# Patient Record
Sex: Female | Born: 1995 | Race: White | Hispanic: No | Marital: Single | Smoking: Never smoker
Health system: Southern US, Community
[De-identification: ages and names within clinical notes are randomized; demographics above are authoritative.]

---
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
## 2022-03-16 ENCOUNTER — Emergency Department (HOSPITAL_COMMUNITY)
Admission: EM | Admit: 2022-03-16 | Discharge: 2022-03-16 | Disposition: A | Discharge status: Home / Self Care | Payer: BC Managed Care – PPO | Attending: Emergency Medicine | Admitting: Emergency Medicine

## 2022-03-16 ENCOUNTER — Encounter (HOSPITAL_COMMUNITY): Payer: Self-pay

## 2022-03-16 ENCOUNTER — Other Ambulatory Visit: Payer: Self-pay

## 2022-03-16 ENCOUNTER — Emergency Department (HOSPITAL_COMMUNITY): Payer: BC Managed Care – PPO

## 2022-03-16 DIAGNOSIS — X500XXA Overexertion from strenuous movement or load, initial encounter: Secondary | ICD-10-CM | POA: Insufficient documentation

## 2022-03-16 DIAGNOSIS — Y9301 Activity, walking, marching and hiking: Secondary | ICD-10-CM | POA: Diagnosis not present

## 2022-03-16 DIAGNOSIS — S93401A Sprain of unspecified ligament of right ankle, initial encounter: Secondary | ICD-10-CM | POA: Diagnosis not present

## 2022-03-16 DIAGNOSIS — S99911A Unspecified injury of right ankle, initial encounter: Secondary | ICD-10-CM | POA: Diagnosis present

## 2022-03-16 NOTE — Discharge Instructions (Signed)
Your x-rays show no evidence for fracture and your injury appears to be a significant sprain to your ankle.  Wear Ace bandage for comfort and compression.  Ice for 20 minutes every 2 hours while awake for the next 2 days.  Elevate your leg is much as possible for the next 2 days.  Take ibuprofen 600 mg every 6 hours as needed for pain.  Follow-up with your primary doctor if symptoms are not improving in the next 1 to 2 weeks.

## 2022-03-16 NOTE — ED Provider Notes (Signed)
  [facility] EMERGENCY DEPARTMENT Provider Note   CSN: [id number] Arrival date & time: 03/16/22  0155     History  Chief Complaint  Patient presents with   Ankle Pain    Jasmine Stevenson is a 26 y.o. female.  Patient is a 26 year old female with no significant past medical history.  She presents with a right ankle injury.  Patient states she was walking across a crosswalk when she stepped awkwardly and turned her right ankle.  She describes some swelling and significant discomfort.  She describes an inability to bear weight.  She also reports feeling a pop when the injury occurred.  The history is provided by the patient.       Home Medications Prior to Admission medications   Not on File      Allergies    Penicillins    Review of Systems   Review of Systems  All other systems reviewed and are negative.   Physical Exam Updated Vital Signs BP 115/89   Pulse (!) 110   Temp 97.9 F (36.6 C) (Oral)   Resp 20   Ht 5\' 2"  (1.575 m)   Wt 56.7 kg   LMP 03/10/2022 (Approximate)   SpO2 99%   BMI 22.86 kg/m  Physical Exam Vitals and nursing note reviewed.  Constitutional:      General: She is not in acute distress.    Appearance: Normal appearance. She is not ill-appearing.  HENT:     Head: Normocephalic and atraumatic.  Pulmonary:     Effort: Pulmonary effort is normal.  Musculoskeletal:     Comments: There is significant swelling and ecchymosis noted over the lateral malleolus.  There is no medial malleoli or tenderness.  DP pulses are easily palpable and motor and sensation are intact throughout the entire foot.  Skin:    General: Skin is warm and dry.  Neurological:     Mental Status: She is alert.     ED Results / Procedures / Treatments   Labs (all labs ordered are listed, but only abnormal results are displayed) Labs Reviewed - No data to display  EKG None  Radiology DG Ankle Complete Right  Result Date: 03/16/2022 CLINICAL DATA:  Twisting  injury to the ankle with pain and swelling, initial encounter EXAM: RIGHT ANKLE - COMPLETE 3+ VIEW COMPARISON:  None Available. FINDINGS: Considerable soft tissue swelling is noted laterally. No acute fracture or dislocation is seen. IMPRESSION: Soft tissue swelling laterally without acute bony abnormality. Electronically Signed   By: 03/18/2022 M.D.   On: 03/16/2022 02:41    Procedures Procedures    Medications Ordered in ED Medications - No data to display  ED Course/ Medical Decision Making/ A&P  X-rays negative for fracture.  This to be treated as a sprain with an Ace bandage, rest, ice, elevate, and follow-up as needed if not improving in the next week.  Final Clinical Impression(s) / ED Diagnoses Final diagnoses:  None    Rx / DC Orders ED Discharge Orders     None         03/18/2022, MD 03/16/22 [phone]

## 2022-03-16 NOTE — ED Triage Notes (Signed)
Pt states she was walking when she stepped onto uneven pavement rolling her right ankle, states she heard a loud pop. Ankle is swollen, pt is able to bare minimal weight on that foot.

## 2022-07-12 ENCOUNTER — Encounter (HOSPITAL_COMMUNITY): Payer: Self-pay

## 2022-07-12 ENCOUNTER — Other Ambulatory Visit: Payer: Self-pay

## 2022-07-12 ENCOUNTER — Emergency Department (HOSPITAL_COMMUNITY)
Admission: EM | Admit: 2022-07-12 | Discharge: 2022-07-12 | Disposition: A | Discharge status: Home / Self Care | Payer: Self-pay | Attending: Emergency Medicine | Admitting: Emergency Medicine

## 2022-07-12 ENCOUNTER — Emergency Department (HOSPITAL_COMMUNITY): Payer: Medicaid Other

## 2022-07-12 DIAGNOSIS — R000 Tachycardia, unspecified: Secondary | ICD-10-CM | POA: Insufficient documentation

## 2022-07-12 DIAGNOSIS — R202 Paresthesia of skin: Secondary | ICD-10-CM | POA: Insufficient documentation

## 2022-07-12 LAB — BASIC METABOLIC PANEL
Anion gap: 9 (ref 5–15)
BUN: 7 mg/dL (ref 6–20)
CO2: 19 mmol/L — ABNORMAL LOW (ref 22–32)
Calcium: 8.7 mg/dL — ABNORMAL LOW (ref 8.9–10.3)
Chloride: 108 mmol/L (ref 98–111)
Creatinine, Ser: 0.67 mg/dL (ref 0.44–1.00)
GFR, Estimated: >60 mL/min (ref >60)
Glucose, Bld: 91 mg/dL (ref 70–99)
Potassium: 3.5 mmol/L (ref 3.5–5.1)
Sodium: 136 mmol/L (ref 135–145)

## 2022-07-12 LAB — CBC WITH DIFFERENTIAL/PLATELET
Abs Immature Granulocytes: 0.03 10*3/uL (ref 0.00–0.07)
Basophils Absolute: 0.1 10*3/uL (ref 0.0–0.1)
Basophils Relative: 1 %
Eosinophils Absolute: 0 10*3/uL (ref 0.0–0.5)
Eosinophils Relative: 0 %
HCT: 41.3 % (ref 36.0–46.0)
Hemoglobin: 14.3 g/dL (ref 12.0–15.0)
Immature Granulocytes: 0 %
Lymphocytes Relative: 17 %
Lymphs Abs: 1.9 10*3/uL (ref 0.7–4.0)
MCH: 30.8 pg (ref 26.0–34.0)
MCHC: 34.6 g/dL (ref 30.0–36.0)
MCV: 88.8 fL (ref 80.0–100.0)
Monocytes Absolute: 0.6 10*3/uL (ref 0.1–1.0)
Monocytes Relative: 5 %
Neutro Abs: 8.5 10*3/uL — ABNORMAL HIGH (ref 1.7–7.7)
Neutrophils Relative %: 77 %
Platelets: 273 10*3/uL (ref 150–400)
RBC: 4.65 MIL/uL (ref 3.87–5.11)
RDW: 12.5 % (ref 11.5–15.5)
WBC: 11.2 10*3/uL — ABNORMAL HIGH (ref 4.0–10.5)
nRBC: 0 % (ref 0.0–0.2)

## 2022-07-12 LAB — PREGNANCY, URINE: Preg Test, Ur: NEGATIVE

## 2022-07-12 LAB — D-DIMER, QUANTITATIVE: D-Dimer, Quant: 1.03 ug/mL-FEU — ABNORMAL HIGH (ref 0.00–0.50)

## 2022-07-12 MED ORDER — IOHEXOL 350 MG/ML SOLN
75.0000 mL | Freq: Once | INTRAVENOUS | Status: AC | PRN
  Administered 2022-07-12: 75 mL via INTRAVENOUS

## 2022-07-12 NOTE — ED Provider Notes (Signed)
[facility] EMERGENCY DEPARTMENT Provider Note   CSN: [id number] Arrival date & time: 07/12/22  2008     History  Chief Complaint  Patient presents with   Numbness    Jasmine Stevenson is a 26 y.o. female.  HPI      Jasmine Stevenson is a 26 y.o. female who presents to the Emergency Department requesting evaluation of numbness and tingling of her right forearm area that started around 3 PM this evening.  She states that she was in the seated position with her elbows on her knees this afternoon crocheting when the symptoms began.  Throughout the afternoon, she states that she has experienced the same tingling sensation radiating up to her shoulder, right lower face, right chest and right lateral upper leg.  She has tingling and numbness of the third fourth and fifth fingers as well.  She is here out of concern for possible blood clot versus stroke.  She does state that most of her symptoms seem to be localized to her elbow area.  She started oral birth control 2 weeks ago.  She denies any chest pain or shortness of breath.  She is accompanied by her mother who denies any changes in speech or gait.  No recent injuries   Home Medications Prior to Admission medications   Not on File      Allergies    Penicillins    Review of Systems   Review of Systems  Constitutional:  Negative for appetite change and chills.  Eyes:  Negative for visual disturbance.  Respiratory:  Negative for shortness of breath.   Cardiovascular:  Negative for chest pain.  Gastrointestinal:  Negative for abdominal pain, nausea and vomiting.  Genitourinary:  Negative for dysuria and flank pain.  Musculoskeletal:  Negative for arthralgias, myalgias and neck pain.  Skin:  Negative for color change and rash.  Neurological:  Positive for numbness (nummbness and tingling right forearm, elbow, upper  arm and right lower face). Negative for dizziness, syncope, facial asymmetry, speech difficulty and headaches.   Psychiatric/Behavioral:  Negative for confusion.     Physical Exam Updated Vital Signs BP (!) 142/99 (BP Location: Right Arm)   Pulse (!) 127   Temp 99.8 F (37.7 C) (Oral)   Resp 16   Ht 5\' 2"  (1.575 m)   Wt 56.7 kg   SpO2 100%   BMI 22.86 kg/m  Physical Exam Vitals and nursing note reviewed.  Constitutional:      General: She is not in acute distress.    Appearance: Normal appearance. She is not ill-appearing or toxic-appearing.  HENT:     Head: Atraumatic.     Mouth/Throat:     Pharynx: Oropharynx is clear.  Eyes:     Extraocular Movements: Extraocular movements intact.     Conjunctiva/sclera: Conjunctivae normal.     Pupils: Pupils are equal, round, and reactive to light.  Cardiovascular:     Rate and Rhythm: Regular rhythm. Tachycardia present.     Pulses: Normal pulses.  Pulmonary:     Effort: Pulmonary effort is normal. No respiratory distress.     Breath sounds: Normal breath sounds. No wheezing or rhonchi.  Chest:     Chest wall: No tenderness.  Abdominal:     Palpations: Abdomen is soft.     Tenderness: There is no abdominal tenderness.  Musculoskeletal:        General: Normal range of motion.     Cervical back: Normal range of motion. No rigidity  or tenderness.  Lymphadenopathy:     Cervical: No cervical adenopathy.  Skin:    General: Skin is warm.     Capillary Refill: Capillary refill takes less than 2 seconds.     Findings: No rash.  Neurological:     General: No focal deficit present.     Mental Status: She is alert.     GCS: GCS eye subscore is 4. GCS verbal subscore is 5. GCS motor subscore is 6.     Sensory: Sensation is intact. No sensory deficit.     Motor: Motor function is intact. No weakness.     Coordination: Coordination is intact.     Comments: CN II through XII intact.  Speech clear.  No pronator drift.  No facial droop.  Normal finger-nose testing.     ED Results / Procedures / Treatments   Labs (all labs ordered are listed,  but only abnormal results are displayed) Labs Reviewed  CBC WITH DIFFERENTIAL/PLATELET - Abnormal; Notable for the following components:      Result Value   WBC 11.2 (*)    Neutro Abs 8.5 (*)    All other components within normal limits  D-DIMER, QUANTITATIVE - Abnormal; Notable for the following components:   D-Dimer, Quant 1.03 (*)    All other components within normal limits  BASIC METABOLIC PANEL - Abnormal; Notable for the following components:   CO2 19 (*)    Calcium 8.7 (*)    All other components within normal limits  PREGNANCY, URINE  POC URINE PREG, ED    EKG None  Radiology CT Angio Chest PE W and/or Wo Contrast  Result Date: 07/12/2022 CLINICAL DATA:  Pulmonary embolism suspected, low to intermediate probability. Positive D-dimer, right arm numbness and tingling. EXAM: CT ANGIOGRAPHY CHEST WITH CONTRAST TECHNIQUE: Multidetector CT imaging of the chest was performed using the standard protocol during bolus administration of intravenous contrast. Multiplanar CT image reconstructions and MIPs were obtained to evaluate the vascular anatomy. RADIATION DOSE REDUCTION: This exam was performed according to the departmental dose-optimization program which includes automated exposure control, adjustment of the mA and/or kV according to patient size and/or use of iterative reconstruction technique. CONTRAST:  59mL OMNIPAQUE IOHEXOL 350 MG/ML SOLN COMPARISON:  None Available. FINDINGS: Cardiovascular: The heart is normal in size and there is no pericardial effusion. The aorta and pulmonary trunk are normal in caliber. No evidence of pulmonary embolism. Mediastinum/Nodes: No enlarged mediastinal, hilar, or axillary lymph nodes. Thyroid gland, trachea, and esophagus demonstrate no significant findings. Soft tissue attenuation is noted in the anterior mediastinum, possible residual thymic tissue. Lungs/Pleura: Lungs are clear. No pleural effusion or pneumothorax. Upper Abdomen: No acute  abnormality. Musculoskeletal: No chest wall abnormality. No acute or significant osseous findings. Review of the MIP images confirms the above findings. IMPRESSION: No evidence of pulmonary embolism or other acute process. Electronically Signed   By: Thornell Sartorius M.D.   On: 07/12/2022 23:15    Procedures Procedures    Medications Ordered in ED Medications - No data to display  ED Course/ Medical Decision Making/ A&P                           Medical Decision Making Patient here for evaluation of numbness and tingling of her right upper extremity, right lateral chest wall and right lower face.  Symptoms began while sitting with her elbows flexed on her knees and crocheting.  Describes stinging numb tingling sensations of  her arm that also involve the third fourth and fifth fingers of the right hand.  No history of recent injury, prior neck pain or carpal tunnel syndrome.  On exam, patient smiling and laughing with family at bedside.  No focal neurodeficits on my exam.  Vital signs, patient is tachycardic.  States she feels anxious. I suspect patient's symptoms are related to anxiety, may also involve some carpal tunnel.  Since patient is tachycardic will need further evaluation for possible PE although this is felt less likely.  If D-dimer reassuring I feel the patient is appropriate for discharge home,  Amount and/or Complexity of Data Reviewed Labs: ordered.    Details: Labs interpreted by me, no significant leukocytosis, chemistries without significant derangement.  D-dimer elevated 1.03, urine preg negative Radiology: ordered.    Details: Because D-dimer elevated patient reports recent oral birth control, CT angio chest ordered for further evaluation of possible PE.  CT angio chest w/o evidence of PE Discussion of management or test interpretation with external provider(s): Pt has reassuring work up.  Remains tachycardic, but improved.  CT angio w/o evidence of PE.  Paraesthesias of the  right arm felt to be positional.  No concerning sx's for emergent process.  Felt to be appropriate for d/c home.             Final Clinical Impression(s) / ED Diagnoses Final diagnoses:  Paresthesias  Tachycardia    Rx / DC Orders ED Discharge Orders     None         Rosey Bath 07/12/22 2337    Eber Hong, MD 07/13/22 2051

## 2022-07-12 NOTE — Discharge Instructions (Signed)
The CT scan of your chest did not show evidence of a blood clot.  Your elevated heart rate may be related to anxiety.  I recommend that you follow-up with your primary care provider for recheck.  Rest your right arm, you can alternate with ice and heat to the area as well.

## 2022-07-12 NOTE — ED Triage Notes (Signed)
POV from home. Cc of right arm numbness and tingling that started around 3pm. Was Crocheting when the numbness started so she is hoping that it might be a pinched nerve. Very anxious in triage.

## 2022-07-14 ENCOUNTER — Emergency Department (HOSPITAL_COMMUNITY): Payer: Self-pay

## 2022-07-14 ENCOUNTER — Other Ambulatory Visit: Payer: Self-pay

## 2022-07-14 ENCOUNTER — Emergency Department (HOSPITAL_COMMUNITY)
Admission: EM | Admit: 2022-07-14 | Discharge: 2022-07-14 | Disposition: A | Discharge status: Home / Self Care | Payer: Self-pay | Attending: Emergency Medicine | Admitting: Emergency Medicine

## 2022-07-14 ENCOUNTER — Encounter (HOSPITAL_COMMUNITY): Payer: Self-pay | Admitting: Emergency Medicine

## 2022-07-14 DIAGNOSIS — R29898 Other symptoms and signs involving the musculoskeletal system: Secondary | ICD-10-CM

## 2022-07-14 DIAGNOSIS — R531 Weakness: Secondary | ICD-10-CM | POA: Insufficient documentation

## 2022-07-14 DIAGNOSIS — R000 Tachycardia, unspecified: Secondary | ICD-10-CM | POA: Insufficient documentation

## 2022-07-14 DIAGNOSIS — R202 Paresthesia of skin: Secondary | ICD-10-CM | POA: Insufficient documentation

## 2022-07-14 DIAGNOSIS — R200 Anesthesia of skin: Secondary | ICD-10-CM | POA: Insufficient documentation

## 2022-07-14 LAB — CBC WITH DIFFERENTIAL/PLATELET
Abs Immature Granulocytes: 0.04 10*3/uL (ref 0.00–0.07)
Basophils Absolute: 0.1 10*3/uL (ref 0.0–0.1)
Basophils Relative: 1 %
Eosinophils Absolute: 0.1 10*3/uL (ref 0.0–0.5)
Eosinophils Relative: 1 %
HCT: 40.4 % (ref 36.0–46.0)
Hemoglobin: 14.2 g/dL (ref 12.0–15.0)
Immature Granulocytes: 0 %
Lymphocytes Relative: 21 %
Lymphs Abs: 1.9 10*3/uL (ref 0.7–4.0)
MCH: 31.1 pg (ref 26.0–34.0)
MCHC: 35.1 g/dL (ref 30.0–36.0)
MCV: 88.6 fL (ref 80.0–100.0)
Monocytes Absolute: 0.6 10*3/uL (ref 0.1–1.0)
Monocytes Relative: 7 %
Neutro Abs: 6.5 10*3/uL (ref 1.7–7.7)
Neutrophils Relative %: 70 %
Platelets: 244 10*3/uL (ref 150–400)
RBC: 4.56 MIL/uL (ref 3.87–5.11)
RDW: 12.4 % (ref 11.5–15.5)
WBC: 9.1 10*3/uL (ref 4.0–10.5)
nRBC: 0 % (ref 0.0–0.2)

## 2022-07-14 LAB — BASIC METABOLIC PANEL
Anion gap: 7 (ref 5–15)
BUN: 7 mg/dL (ref 6–20)
CO2: 20 mmol/L — ABNORMAL LOW (ref 22–32)
Calcium: 8.7 mg/dL — ABNORMAL LOW (ref 8.9–10.3)
Chloride: 110 mmol/L (ref 98–111)
Creatinine, Ser: 0.67 mg/dL (ref 0.44–1.00)
GFR, Estimated: >60 mL/min (ref >60)
Glucose, Bld: 90 mg/dL (ref 70–99)
Potassium: 3.7 mmol/L (ref 3.5–5.1)
Sodium: 137 mmol/L (ref 135–145)

## 2022-07-14 MED ORDER — GADOBUTROL 1 MMOL/ML IV SOLN
7.0000 mL | Freq: Once | INTRAVENOUS | Status: AC | PRN
  Administered 2022-07-14: 7 mL via INTRAVENOUS

## 2022-07-14 MED ORDER — IBUPROFEN 600 MG PO TABS: 600.0000 mg | ORAL_TABLET | Freq: Four times a day (QID) | ORAL | 0 refills | Status: AC | PRN

## 2022-07-14 MED ORDER — LORAZEPAM 2 MG/ML IJ SOLN
1.0000 mg | Freq: Once | INTRAMUSCULAR | Status: AC
  Administered 2022-07-14: 1 mg via INTRAVENOUS
  Filled 2022-07-14: qty 1

## 2022-07-14 NOTE — ED Notes (Signed)
Assisted pt with removal of piercings. Prvoided warm blanket. Pt states she does not have any metal on, nor is she claustrophobic.

## 2022-07-14 NOTE — ED Provider Notes (Signed)
  Provider Note MRN:  [id number]  Arrival date & time: 07/14/22    ED Course and Medical Decision Making  Assumed care from D Glick at shift change.  See note from prior team for complete details, in brief:  26 yo female Here with arm numbness she attributes to poor posture while crocheting Seen 11/25 with similar Only right arm is affected MRI c-spine w/wo pending Was noted similar tachy during last visit 11/25 Pt reports typically very anxious in healthcare setting, HR will go up to 120-140's per pt EKG sinus tachy, no cp or palpitations, had recent CTPE study at last ED visit which did not show PE  Plan per prior physician f/u MRI, likely o/p neuro f/u  MRI has resulted, it is WNL. Pt re-assessed, she is feeling somewhat better. Somewhat improved ROM/sensation to her RUE. There is reproducible paresthesias to her RUE with percussion of guyon canal, no discomfort with phalen/reverse phalen/percussion at base of wrist.   Recommend she f/u with neuro for EMG, further eval. Consider ulnar nerve related issue a/w her positioning from recent activity.  The patient improved significantly and was discharged in stable condition. Detailed discussions were had with the patient regarding current findings, and need for close f/u with PCP or on call doctor. The patient has been instructed to return immediately if the symptoms worsen in any way for re-evaluation. Patient verbalized understanding and is in agreement with current care plan. All questions answered prior to discharge.    Clinical Course as of 07/14/22 0843  Mon Jul 14, 2022  0801 MRI has resulted, WNL [SG]    Clinical Course User Index [SG] Sloan Leiter, DO     Procedures  Final Clinical Impressions(s) / ED Diagnoses     ICD-10-CM   1. Paresthesia of right arm  R20.2     2. Weakness of right arm  R29.898       ED Discharge Orders          Ordered    Ambulatory referral to Neurology       Comments: An appointment is  requested in approximately: 1 week   07/14/22 0821    ibuprofen (ADVIL) 600 MG tablet  Every 6 hours PRN        07/14/22 0839              Discharge Instructions      It was a pleasure caring for you today in the emergency department.  Please follow up with PCP in regards to your elevated heart rate  Please follow up with neurology in regards to arm paresthesia / tingling sensation  Please return to the emergency department for any worsening or worrisome symptoms.           Sloan Leiter, DO 07/14/22 [id number]

## 2022-07-14 NOTE — Discharge Instructions (Addendum)
It was a pleasure caring for you today in the emergency department.  Please follow up with PCP in regards to your elevated heart rate  Please follow up with neurology in regards to arm paresthesia / tingling sensation  Please return to the emergency department for any worsening or worrisome symptoms.

## 2022-07-14 NOTE — ED Triage Notes (Signed)
Pt c/o continued right arm numbness and tingling since yesterday. Seen yesterday for same. Pt also c/o dry mouth and not being able to sleep good tonight.

## 2022-07-14 NOTE — ED Provider Notes (Signed)
[facility] EMERGENCY DEPARTMENT Provider Note   CSN: [id number] Arrival date & time: 07/14/22  0358     History  Chief Complaint  Patient presents with   Numbness    Jasmine Stevenson is a 26 y.o. female.  The history is provided by the patient.  She has no significant past history and comes in complaining of numbness and weakness in her right arm which started yesterday afternoon.  She was seen in the emergency department yesterday evening and had a work-up which included CT angiogram of the chest to rule out pulmonary embolism (CT angiogram obtained because of tachycardia) and comes in with progression of the numbness and now inability to use the muscles in her arm.  She denies any problems with her leg or face.  She denies any neck pain.  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   Home Medications Prior to Admission medications   Not on File      Allergies    Penicillins    Review of Systems   Review of Systems  All other systems reviewed and are negative.   Physical Exam Updated Vital Signs BP (!) 146/100 (BP Location: Left Arm)   Pulse (!) 120   Resp 16   Ht 5\' 2"  (1.575 m)   Wt 56.7 kg   SpO2 99%   BMI 22.86 kg/m  Physical Exam Vitals and nursing note reviewed.   26 year old female, resting comfortably and in no acute distress. Vital signs are significant for elevated blood pressure and heart rate. Oxygen saturation is 99%, which is normal. Head is normocephalic and atraumatic. PERRLA, EOMI. Oropharynx is clear. Neck is nontender and supple without adenopathy or JVD. Back is nontender and there is no CVA tenderness. Lungs are clear without rales, wheezes, or rhonchi. Chest is nontender. Heart h is tachycardic without murmur. Abdomen is soft, flat, nontender. Extremities have no cyanosis or edema, full range of motion is  present. Skin is warm and dry without rash. Neurologic: Mental status is normal, cranial nerves are intact.  There is marked decrease sensation to the right arm with normal sensation beginning at the midline.  Sensation in the legs is normal.  Strength is normal in the left arm and both legs.  Strength is decreased in the right arm.  She has strength of 1/5 in muscles of finger extension and flexion, wrist flexion and extension.  Strength is 4/5 for elbow extension and flexion.  There is normal strength of shoulder forward flexion but I am having difficulty having her cooperate with assessment of shoulder abduction.  ED Results / Procedures / Treatments   Results for orders placed or performed during the hospital encounter of 07/14/22  Basic metabolic panel  Result Value Ref Range   Sodium 137 135 - 145 mmol/L   Potassium 3.7 3.5 - 5.1 mmol/L   Chloride 110 98 - 111 mmol/L   CO2 20 (L) 22 - 32 mmol/L   Glucose, Bld 90 70 - 99 mg/dL   BUN 7 6 - 20 mg/dL   Creatinine, Ser 07/16/22 0.44 - 1.00 mg/dL   Calcium 8.7 (L) 8.9 - 10.3 mg/dL   GFR, Estimated 9.21 >19 mL/min   Anion gap 7 5 - 15  CBC with Differential  Result Value Ref Range   WBC 9.1 4.0 - 10.5 K/uL   RBC 4.56 3.87 - 5.11 MIL/uL  Hemoglobin 14.2 12.0 - 15.0 g/dL   HCT 58.0 99.8 - 33.8 %   MCV 88.6 80.0 - 100.0 fL   MCH 31.1 26.0 - 34.0 pg   MCHC 35.1 30.0 - 36.0 g/dL   RDW 25.0 53.9 - 76.7 %   Platelets 244 150 - 400 K/uL   nRBC 0.0 0.0 - 0.2 %   Neutrophils Relative % 70 %   Neutro Abs 6.5 1.7 - 7.7 K/uL   Lymphocytes Relative 21 %   Lymphs Abs 1.9 0.7 - 4.0 K/uL   Monocytes Relative 7 %   Monocytes Absolute 0.6 0.1 - 1.0 K/uL   Eosinophils Relative 1 %   Eosinophils Absolute 0.1 0.0 - 0.5 K/uL   Basophils Relative 1 %   Basophils Absolute 0.1 0.0 - 0.1 K/uL   Immature Granulocytes 0 %   Abs Immature Granulocytes 0.04 0.00 - 0.07 K/uL    Procedures Procedures    Medications Ordered in ED Medications - No data to  display  ED Course/ Medical Decision Making/ A&P                           Medical Decision Making Amount and/or Complexity of Data Reviewed Labs: ordered. Radiology: ordered.  Risk Prescription drug management.   Weakness and numbness of the right arm.  Pattern does not follow a strict neurologic pathway of either cervical radiculopathy or peripheral neuropathy.  Consider factitious disorder.  Consider multiple sclerosis.  Doubt Guillain-Barr syndrome.  No tick identified for tick paralysis.  Symptoms are not consistent with stroke.  I have reviewed her prior records, and at her ED visit on 07/12/2022 she had mild leukocytosis and mild metabolic acidosis and negative pregnancy test.D-dimer was elevated, and CT angiogram of the chest was unremarkable.  I will repeat CBC and basic metabolic panel and I have ordered MRI scan of the cervical spine with and without contrast.  I have reviewed and interpreted her laboratory test, and my interpretation is mild metabolic acidosis and otherwise normal basic metabolic panel and CBC.  MRI of cervical spine is pending.  Case is signed out to Dr. Wallace Cullens, oncoming physician.  Final Clinical Impression(s) / ED Diagnoses Final diagnoses:  Paresthesia of right arm  Weakness of right arm    Rx / DC Orders ED Discharge Orders     None         Dione Booze, MD 07/14/22 [phone]

## 2022-07-15 ENCOUNTER — Encounter: Payer: Self-pay | Admitting: Neurology

## 2022-09-03 ENCOUNTER — Ambulatory Visit: Payer: Medicaid Other | Admitting: Neurology
